# Patient Record
Sex: Female | Born: 2000 | Race: White | Hispanic: No | Marital: Single | Smoking: Never smoker
Health system: Southern US, Community
[De-identification: ages and names within clinical notes are randomized; demographics above are authoritative.]

## PROBLEM LIST (undated history)

## (undated) ENCOUNTER — Inpatient Hospital Stay (HOSPITAL_COMMUNITY): Payer: Self-pay

## (undated) DIAGNOSIS — J189 Pneumonia, unspecified organism: Secondary | ICD-10-CM

## (undated) DIAGNOSIS — J40 Bronchitis, not specified as acute or chronic: Secondary | ICD-10-CM

## (undated) HISTORY — PX: TONSILLECTOMY: SUR1361

---

## 2019-10-06 ENCOUNTER — Emergency Department (HOSPITAL_COMMUNITY)
Admission: EM | Admit: 2019-10-06 | Discharge: 2019-10-07 | Disposition: A | Discharge status: Home / Self Care | Payer: BC Managed Care – PPO | Attending: Emergency Medicine | Admitting: Emergency Medicine

## 2019-10-06 ENCOUNTER — Encounter (HOSPITAL_COMMUNITY): Payer: Self-pay

## 2019-10-06 ENCOUNTER — Emergency Department (HOSPITAL_COMMUNITY): Payer: BC Managed Care – PPO

## 2019-10-06 DIAGNOSIS — Z20822 Contact with and (suspected) exposure to covid-19: Secondary | ICD-10-CM | POA: Diagnosis not present

## 2019-10-06 DIAGNOSIS — R079 Chest pain, unspecified: Secondary | ICD-10-CM

## 2019-10-06 DIAGNOSIS — R0789 Other chest pain: Secondary | ICD-10-CM | POA: Diagnosis not present

## 2019-10-06 HISTORY — DX: Pneumonia, unspecified organism: J18.9

## 2019-10-06 HISTORY — DX: Bronchitis, not specified as acute or chronic: J40

## 2019-10-06 LAB — BASIC METABOLIC PANEL
Anion gap: 11 (ref 5–15)
BUN: 11 mg/dL (ref 6–20)
CO2: 22 mmol/L (ref 22–32)
Calcium: 9.3 mg/dL (ref 8.9–10.3)
Chloride: 106 mmol/L (ref 98–111)
Creatinine, Ser: 0.85 mg/dL (ref 0.44–1.00)
GFR calc Af Amer: >60 mL/min (ref >60)
GFR calc non Af Amer: >60 mL/min (ref >60)
Glucose, Bld: 94 mg/dL (ref 70–99)
Potassium: 3.7 mmol/L (ref 3.5–5.1)
Sodium: 139 mmol/L (ref 135–145)

## 2019-10-06 LAB — CBC
HCT: 44.4 % (ref 36.0–46.0)
Hemoglobin: 14.6 g/dL (ref 12.0–15.0)
MCH: 29.3 pg (ref 26.0–34.0)
MCHC: 32.9 g/dL (ref 30.0–36.0)
MCV: 89.2 fL (ref 80.0–100.0)
Platelets: 363 10*3/uL (ref 150–400)
RBC: 4.98 MIL/uL (ref 3.87–5.11)
RDW: 12.7 % (ref 11.5–15.5)
WBC: 10.4 10*3/uL (ref 4.0–10.5)
nRBC: 0 % (ref 0.0–0.2)

## 2019-10-06 LAB — I-STAT BETA HCG BLOOD, ED (MC, WL, AP ONLY): I-stat hCG, quantitative: <5 m[IU]/mL (ref <5)

## 2019-10-06 LAB — TROPONIN I (HIGH SENSITIVITY): Troponin I (High Sensitivity): <2 ng/L (ref <18)

## 2019-10-06 MED ORDER — SODIUM CHLORIDE 0.9% FLUSH: 3.0000 mL | Freq: Once | INTRAVENOUS | Status: DC

## 2019-10-06 NOTE — ED Notes (Signed)
Pt had near syncopal episode in triage, repeat BP 93/54, pt given a few minutes and taken to lobby in wheelchair

## 2019-10-06 NOTE — ED Triage Notes (Signed)
Pt states that for the past 6 days she has been having central CP, and a few hours ago began to have pain in her L arm, neuro intact bilaterally. Pain is worse with a deep breath.

## 2019-10-07 LAB — POC SARS CORONAVIRUS 2 AG -  ED: SARS Coronavirus 2 Ag: NEGATIVE

## 2019-10-07 LAB — D-DIMER, QUANTITATIVE: D-Dimer, Quant: <0.27 ug/mL-FEU (ref 0.00–0.50)

## 2019-10-07 LAB — TROPONIN I (HIGH SENSITIVITY): Troponin I (High Sensitivity): <2 ng/L (ref <18)

## 2019-10-07 MED ORDER — SODIUM CHLORIDE 0.9 % IV BOLUS
1000.0000 mL | Freq: Once | INTRAVENOUS | Status: AC
  Administered 2019-10-07: 03:00:00 1000 mL via INTRAVENOUS

## 2019-10-07 NOTE — ED Provider Notes (Signed)
[facility] EMERGENCY DEPARTMENT Provider Note   CSN: [id number] Arrival date & time: 10/06/19  2144     History Chief Complaint  Patient presents with  . Chest Pain    Michelle Le is a 19 y.o. female.  Patient presents to the emergency department with a chief complaint of chest pain.  She states that she has been having symptoms for about the past 6 days.  She states that today she noticed some increased pain with breathing.  States that she also sometimes feels anxious, but also has "bad lungs" and has had pneumonia and bronchitis before.  She states that she feels tight when she takes a deep breath.  Denies any fever or chills.  She has had cough.  Questionable coronavirus exposures.  She does use OCPs, but denies any recent long travel, surgery, or immobilization.  Denies any history of PE or DVT.  The history is provided by the patient. No language interpreter was used.       Past Medical History:  Diagnosis Date  . Bronchitis   . Pneumonia     There are no problems to display for this patient.   Past Surgical History:  Procedure Laterality Date  . TONSILLECTOMY       OB History   No obstetric history on file.     No family history on file.  Social History   Tobacco Use  . Smoking status: Never Smoker  . Smokeless tobacco: Never Used  Substance Use Topics  . Alcohol use: Never  . Drug use: Never    Home Medications Prior to Admission medications   Not on File    Allergies    Amoxicillin, Azithromycin, Bee venom, and Penicillins  Review of Systems   Review of Systems  All other systems reviewed and are negative.   Physical Exam Updated Vital Signs BP 124/68   Pulse 75   Temp 98.2 F (36.8 C) (Oral)   Resp 16   LMP 08/25/2019   SpO2 99%   Physical Exam Vitals and nursing note reviewed.  Constitutional:      General: She is not in acute distress.    Appearance: She is well-developed.  HENT:     Head:  Normocephalic and atraumatic.  Eyes:     Conjunctiva/sclera: Conjunctivae normal.  Cardiovascular:     Rate and Rhythm: Normal rate and regular rhythm.     Heart sounds: No murmur.  Pulmonary:     Effort: Pulmonary effort is normal. No respiratory distress.     Breath sounds: Normal breath sounds.  Abdominal:     Palpations: Abdomen is soft.     Tenderness: There is no abdominal tenderness.  Musculoskeletal:        General: Normal range of motion.     Cervical back: Neck supple.  Skin:    General: Skin is warm and dry.  Neurological:     Mental Status: She is alert and oriented to person, place, and time.  Psychiatric:        Mood and Affect: Mood normal.        Behavior: Behavior normal.     ED Results / Procedures / Treatments   Labs (all labs ordered are listed, but only abnormal results are displayed) Labs Reviewed  BASIC METABOLIC PANEL  CBC  D-DIMER, QUANTITATIVE (NOT AT [facility])  I-STAT BETA HCG BLOOD, ED ([facility], [facility], [facility] ONLY)  POC SARS CORONAVIRUS 2 AG -  ED  TROPONIN I (HIGH SENSITIVITY)  TROPONIN I (HIGH SENSITIVITY)    EKG EKG Interpretation  Date/Time:  Wednesday October 06 2019 22:10:39 EST Ventricular Rate:  81 PR Interval:  108 QRS Duration: 92 QT Interval:  344 QTC Calculation: 399 R Axis:   91 Text Interpretation: Sinus rhythm with marked sinus arrhythmia with short PR Right atrial enlargement Rightward axis Pulmonary disease pattern Confirmed by Palumbo, April ([id number]) on 10/07/2019 3:03:13 AM   Radiology DG Chest 2 View  Result Date: 10/06/2019 CLINICAL DATA:  Chest pain and left arm numbness. EXAM: CHEST - 2 VIEW COMPARISON:  None. FINDINGS: The heart size and mediastinal contours are within normal limits. Both lungs are clear. The visualized skeletal structures are unremarkable. IMPRESSION: No active cardiopulmonary disease. Electronically Signed   By: Aram Candela M.D.   On: 10/06/2019 22:29    Procedures Procedures (including critical care  time)  Medications Ordered in ED Medications  sodium chloride flush (NS) 0.9 % injection 3 mL (has no administration in time range)  sodium chloride 0.9 % bolus 1,000 mL (1,000 mLs Intravenous New Bag/Given 10/07/19 0235)    ED Course  I have reviewed the triage vital signs and the nursing notes.  Pertinent labs & imaging results that were available during my care of the patient were reviewed by me and considered in my medical decision making (see chart for details).    MDM Rules/Calculators/A&P                      Patient presents with chest pain x6 days, seems to be pleuritic in nature.  Patient did have near syncope in triage as blood was being drawn, likely vasovagal, BP was 93/54.  BP is stable now.  She is not orthostatic.  DDx includes ACS, PE, pneumothorax, aortic dissection, esophageal rupture, pericarditis, chest wall pain.  Doubt ACS, normal troponin, no ischemic EKG findings, is low risk for ACS given age and lack of risk factors.  Check D-dimer given pleuritic chest pain and OCP use, patient is not tachycardic nor hypoxic, if D-dimer is negative, I think she will be low enough risk to be discharged from PE standpoint.  No evidence of pneumothorax on CXR.  Doubt dissection, no mediastinal widening on CXR, no ripping/tearing chest pain, neurovascularly intact.  Doubt pericarditis, no positional changes, or diffuse ST elevations on EKG.  Pain is not reproducible, doubt MSK.  All findings were discussed with patient.  Patient understands and agrees with the plan.    Covid test is still pending.  I would not be surprised if this is positive, and is the cause of the patient's symptoms.  Otherwise, see no emergent indication for additional work-up.  I do not believe the patient needs admission to the hospital.  I believe she can be safely discharged home with close primary care follow-up.  Patient understands agrees with the plan.  Michelle Le was evaluated in Emergency  Department on 10/07/2019 for the symptoms described in the history of present illness. She was evaluated in the context of the global COVID-19 pandemic, which necessitated consideration that the patient might be at risk for infection with the SARS-CoV-2 virus that causes COVID-19. Institutional protocols and algorithms that pertain to the evaluation of patients at risk for COVID-19 are in a state of rapid change based on information released by regulatory bodies including the CDC and federal and state organizations. These policies and algorithms were followed during the patient's care in the ED.   Final Clinical Impression(s) / ED  Diagnoses Final diagnoses:  Nonspecific chest pain    Rx / DC Orders ED Discharge Orders    None       Roxy Horseman, PA-C 10/07/19 0340    Palumbo, April, MD 10/07/19 [phone]

## 2019-10-07 NOTE — Discharge Instructions (Signed)
You should isolate at home until your covid test results.  If positive, you need to quarantine at home for 14 days.

## 2021-08-29 IMAGING — CR DG CHEST 2V
2 series · 2 of 2 positions shown · non-contrast
Comparison: None.

CLINICAL DATA: Chest pain and left arm numbness.

EXAM:
CHEST - 2 VIEW

[chest lat]
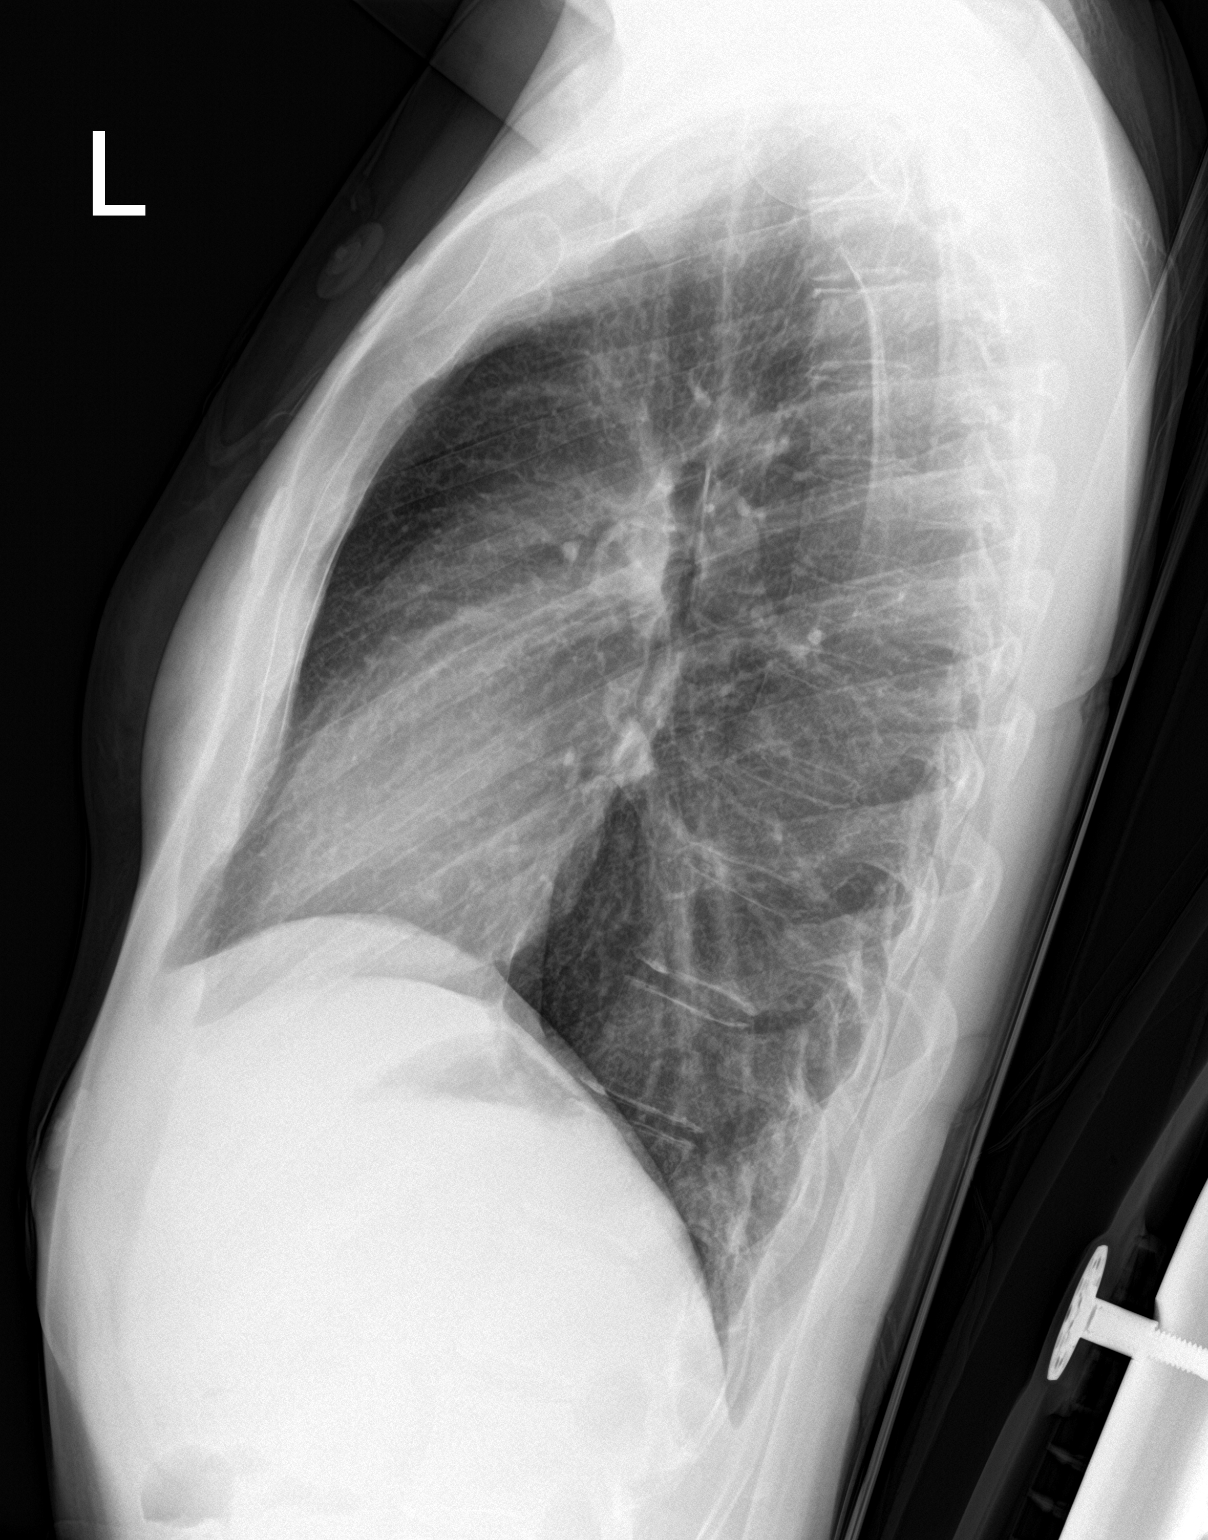

[chest ap]
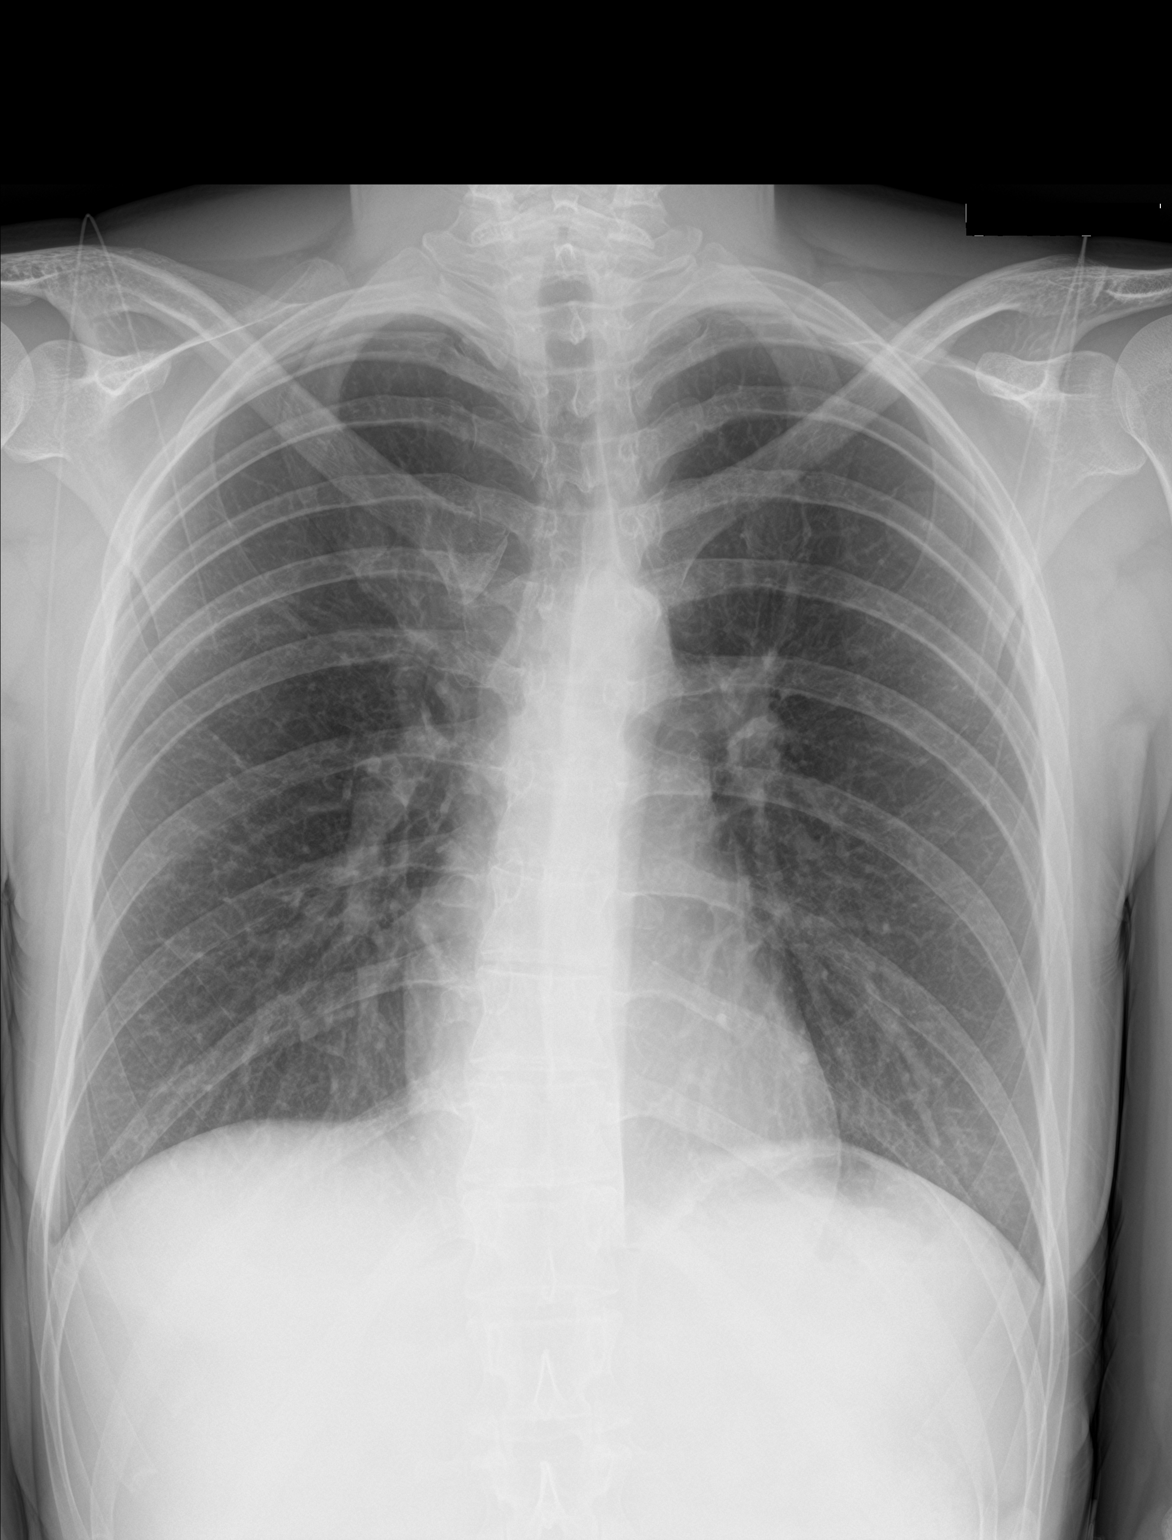

[2 of 2 positions shown; findings below may reference images not displayed]

FINDINGS: The heart size and mediastinal contours are within normal limits.
Both lungs are clear. The visualized skeletal structures are
unremarkable.
IMPRESSION: No active cardiopulmonary disease.

## 2022-09-02 NOTE — L&D Delivery Note (Signed)
Delivery Note Michelle Le is a 22 year old G1 now P1001 at [redacted]w[redacted]d who had a spontaneous delivery at 01:30 a healthy baby boy Michelle Fireman!") was delivered via  (Presentation: vertex; occiput anterior ).  APGAR: 9, 9; weight pending at the time of note writing.     Admitted for labor. Progressed normally and desired AROM, which was performed. Received epidural for pain management. Pushed for 1 hour and 28 minutes. Baby was delivered without difficulty. One loose nuchal was reduced after delivery of the infant's head, shoulders were delivered easily. Copious meconium stained fluid flowed out after delivery of the baby. Delayed cord clamping for 60 seconds.  Delivery of placenta was spontaneous. Placenta was found to be intact, meconium stained, 3 -vessel cord was noted. The fundus was found to be firm. Periurethral and small right hymenal ring tears repaired in the normal sterile fashion with 3-0 vicryl rapide. Estimated blood loss 250cc. Cord gases not sent. Instrument and gauze counts were correct at the end of the procedure.   Placenta status:  intact, meconium stained, 3 -vessel cord  with the following complications: none  Anesthesia:  epidural Episiotomy:  none Lacerations:  Periurethral and small right hymenal ring Suture Repair: 3.0 vicryl rapide Est. Blood Loss (mL):  250cc  Mom to postpartum.  Baby to Couplet care / Skin to Skin.  Michelle Le 08/26/2023, 2:04 AM

## 2023-01-17 LAB — OB RESULTS CONSOLE GC/CHLAMYDIA
Chlamydia: NEGATIVE
Neisseria Gonorrhea: NEGATIVE

## 2023-02-13 LAB — OB RESULTS CONSOLE HIV ANTIBODY (ROUTINE TESTING): HIV: NONREACTIVE

## 2023-02-13 LAB — OB RESULTS CONSOLE HEPATITIS B SURFACE ANTIGEN: Hepatitis B Surface Ag: NEGATIVE

## 2023-02-13 LAB — OB RESULTS CONSOLE RPR: RPR: NONREACTIVE

## 2023-02-13 LAB — OB RESULTS CONSOLE RUBELLA ANTIBODY, IGM: Rubella: IMMUNE

## 2023-08-07 ENCOUNTER — Other Ambulatory Visit: Payer: Self-pay

## 2023-08-07 ENCOUNTER — Inpatient Hospital Stay (HOSPITAL_COMMUNITY)
Admission: AD | Admit: 2023-08-07 | Discharge: 2023-08-07 | Disposition: A | Discharge status: Home / Self Care | Payer: Managed Care, Other (non HMO) | Attending: Obstetrics and Gynecology | Admitting: Obstetrics and Gynecology

## 2023-08-07 ENCOUNTER — Encounter (HOSPITAL_COMMUNITY): Payer: Self-pay | Admitting: Obstetrics and Gynecology

## 2023-08-07 DIAGNOSIS — O98813 Other maternal infectious and parasitic diseases complicating pregnancy, third trimester: Secondary | ICD-10-CM | POA: Insufficient documentation

## 2023-08-07 DIAGNOSIS — B3731 Acute candidiasis of vulva and vagina: Secondary | ICD-10-CM | POA: Insufficient documentation

## 2023-08-07 DIAGNOSIS — R42 Dizziness and giddiness: Secondary | ICD-10-CM | POA: Diagnosis present

## 2023-08-07 DIAGNOSIS — O479 False labor, unspecified: Secondary | ICD-10-CM

## 2023-08-07 DIAGNOSIS — O42913 Preterm premature rupture of membranes, unspecified as to length of time between rupture and onset of labor, third trimester: Secondary | ICD-10-CM | POA: Diagnosis not present

## 2023-08-07 DIAGNOSIS — Z3A36 36 weeks gestation of pregnancy: Secondary | ICD-10-CM | POA: Insufficient documentation

## 2023-08-07 DIAGNOSIS — O26893 Other specified pregnancy related conditions, third trimester: Secondary | ICD-10-CM | POA: Diagnosis not present

## 2023-08-07 DIAGNOSIS — O23593 Infection of other part of genital tract in pregnancy, third trimester: Secondary | ICD-10-CM | POA: Insufficient documentation

## 2023-08-07 DIAGNOSIS — O368130 Decreased fetal movements, third trimester, not applicable or unspecified: Secondary | ICD-10-CM | POA: Insufficient documentation

## 2023-08-07 LAB — RUPTURE OF MEMBRANE (ROM)PLUS: Rom Plus: NEGATIVE

## 2023-08-07 LAB — URINALYSIS, ROUTINE W REFLEX MICROSCOPIC
Bilirubin Urine: NEGATIVE
Glucose, UA: NEGATIVE mg/dL
Hgb urine dipstick: NEGATIVE
Ketones, ur: NEGATIVE mg/dL
Leukocytes,Ua: NEGATIVE
Nitrite: NEGATIVE
Protein, ur: NEGATIVE mg/dL
Specific Gravity, Urine: 1.013 (ref 1.005–1.030)
pH: 7 (ref 5.0–8.0)

## 2023-08-07 LAB — WET PREP, GENITAL
Clue Cells Wet Prep HPF POC: NONE SEEN
Sperm: NONE SEEN
Trich, Wet Prep: NONE SEEN
WBC, Wet Prep HPF POC: <10 (ref <10)
Yeast Wet Prep HPF POC: NONE SEEN

## 2023-08-07 LAB — POCT FERN TEST

## 2023-08-07 MED ORDER — FLUCONAZOLE 150 MG PO TABS: 150.0000 mg | ORAL_TABLET | Freq: Once | ORAL | 1 refills | Status: AC

## 2023-08-07 NOTE — MAU Provider Note (Addendum)
Attestation of Supervision of Student:  I confirm that I have verified the information documented in the medical residents's note and that I have also personally supervised the history, physical exam and all medical decision making activities.  I have verified that all services and findings are accurately documented in this student's note; and I agree with management and plan as outlined in the documentation. I have also made any necessary editorial changes.  Richardson Landry, CNM [facility] [employer], [facility] 08/07/2023 9:34 PM    History     CSN: [id number]  Arrival date and time: 08/07/23 1706   Event Date/Time   First Provider Initiated Contact with Patient 08/07/23 1741      Chief Complaint  Patient presents with   Dizziness   Decreased Fetal Movement   Past 2 days, feeling very dizzy and was having headache. Today, noticed shooting pain up the spine and decreased fetal movement. Had noticed sensations that feel like the baby is hiccupping for a few weeks, states this has been more frequent especially at night recently. Has not tried any pain medications for the headache or back pain. States she feels she has been urinating more frequently during the night. Denies burning with urination or vaginal itching. Reports some increase in vaginal discharge, states this is white and thick. Since arriving to the MAU, patient noticed that her underwear were soaked with clear fluid. Felt the baby moving upon arrival to MAU.    OB History     Gravida  1   Para      Term      Preterm      AB      Living         SAB      IAB      Ectopic      Multiple      Live Births              Past Medical History:  Diagnosis Date   Bronchitis    Pneumonia     Past Surgical History:  Procedure Laterality Date   TONSILLECTOMY      No family history on file.  Social History   Tobacco Use   Smoking status: Never   Smokeless tobacco: Never   Substance Use Topics   Alcohol use: Never   Drug use: Never    Allergies:  Allergies  Allergen Reactions   Amoxicillin Hives   Azithromycin Hives   Bee Venom Swelling   Penicillins Hives    Medications Prior to Admission  Medication Sig Dispense Refill Last Dose   Prenatal Vit-Fe Fumarate-FA (PRENATAL MULTIVITAMIN) TABS tablet Take 1 tablet by mouth daily at 12 noon.       Review of Systems  Respiratory:         Reports dyspnea with exertion last night. No dyspnea at rest or currently  Genitourinary:  Positive for urgency and vaginal discharge.  Musculoskeletal:  Positive for back pain.   Physical Exam   Blood pressure 126/76, pulse 86, temperature 98.7 F (37.1 C), temperature source Oral, resp. rate 18, height 5\' 8"  (1.727 m), weight 81.6 kg, SpO2 98%.  Physical Exam Exam conducted with a chaperone present.  Constitutional:      Appearance: Normal appearance.  HENT:     Head: Normocephalic and atraumatic.  Pulmonary:     Effort: Pulmonary effort is normal.  Abdominal:     Palpations: Abdomen is soft.  Genitourinary:    Labia:  Right: Rash present.        Left: Rash present.      Comments: Labia erythematous Thick, white discharge present at introitus Neurological:     Mental Status: She is alert.     Fetal Assessment 140 bpm, Mod Var, -Decels, +Accels Toco: irregular, every 5-6 minutes  MAU Course   Results for orders placed or performed during the hospital encounter of 08/07/23 (from the past 24 hour(s))  Wet prep, genital     Status: None   Collection Time: 08/07/23  6:03 PM  Result Value Ref Range   Yeast Wet Prep HPF POC NONE SEEN NONE SEEN   Trich, Wet Prep NONE SEEN NONE SEEN   Clue Cells Wet Prep HPF POC NONE SEEN NONE SEEN   WBC, Wet Prep HPF POC <10 <10   Sperm NONE SEEN   Fern Test     Status: Normal   Collection Time: 08/07/23  6:35 PM  Result Value Ref Range   POCT Fern Test     No results found.  MDM PE Labs: wet prep,  UA, fern test, ROM plus EFM  Assessment and Plan  22 year old G1P0  SIUP at [redacted]w[redacted]d Cat 1 FT  Decreased fetal movement -Exam findings discussed -Fern test negative -reassuringly reactive FHT  Candida vaginitis -wet prep negative but exam findings and history consistent with candidal infection  19:20- Reassessed patient, who appears more uncomfortable compared to exam on arrival to MAU. Cervical exam showed 2/90/-3. Pending ROM plus. Will repeat cervical exam in 1-2 hours.   Lorayne Bender PGY-1, Family Medicine 08/07/2023, 6:39 PM   Hand off of care to Lamont Snowball, CNM at 2000.  Reassessment (8:35pm)  Patient with no cervical change after an hour with same practitioner performing exam.  ROM plus negative.  Given information regarding early labor, when to return to MAU. Patient has appointment 08/13/23 with regular OB provider. Strict precautions for bleeding. Discharged in stable condition.  Lamont Snowball, MSN, CNM, RNC-OB Certified Nurse Midwife, [facility] 08/07/2023 9:38 PM

## 2023-08-07 NOTE — Discharge Instructions (Signed)

## 2023-08-07 NOTE — MAU Note (Signed)
..  Michelle Le is a 22 y.o. at [redacted]w[redacted]d here in MAU reporting: DFM since this morning around 0200 with abnormal occasional "thumps". She is also having dizziness and sharp shooting pain up her spine but no pain currently. She has notices thin liquid in her underwear today. Denies vaginal bleeding.  Onset of complaint: 08/06/23 Pain score: 0/10 Vitals:   08/07/23 1726  BP: 126/76  Pulse: 86  Resp: 18  Temp: 98.7 F (37.1 C)  SpO2: 98%     FHT:130 Lab orders placed from triage:  UA

## 2023-08-13 LAB — OB RESULTS CONSOLE GBS: GBS: NEGATIVE

## 2023-08-23 ENCOUNTER — Inpatient Hospital Stay (HOSPITAL_COMMUNITY)
Admission: AD | Admit: 2023-08-23 | Discharge: 2023-08-23 | Disposition: A | Discharge status: Home / Self Care | Payer: Managed Care, Other (non HMO) | Source: Home / Self Care | Attending: Obstetrics and Gynecology | Admitting: Obstetrics and Gynecology

## 2023-08-23 ENCOUNTER — Encounter (HOSPITAL_COMMUNITY): Payer: Self-pay | Admitting: Obstetrics and Gynecology

## 2023-08-23 DIAGNOSIS — O368130 Decreased fetal movements, third trimester, not applicable or unspecified: Secondary | ICD-10-CM | POA: Insufficient documentation

## 2023-08-23 DIAGNOSIS — O471 False labor at or after 37 completed weeks of gestation: Secondary | ICD-10-CM | POA: Insufficient documentation

## 2023-08-23 DIAGNOSIS — O26893 Other specified pregnancy related conditions, third trimester: Secondary | ICD-10-CM | POA: Diagnosis not present

## 2023-08-23 DIAGNOSIS — Z3A38 38 weeks gestation of pregnancy: Secondary | ICD-10-CM

## 2023-08-23 DIAGNOSIS — Z3689 Encounter for other specified antenatal screening: Secondary | ICD-10-CM

## 2023-08-23 NOTE — Discharge Instructions (Signed)
2/3-1-1 Rule Go to MAU for painful contractions every 2-3 minutes, lasting 1 minute each for 1.5 hours.  

## 2023-08-23 NOTE — MAU Provider Note (Signed)
History     CSN: [id number]  Arrival date and time: 08/23/23 7829   Event Date/Time   First Provider Initiated Contact with Patient 08/23/23 [phone]      Chief Complaint  Patient presents with   Decreased Fetal Movement   HPI [name] is a 22 y.o. year old G1P0 female at [redacted]w[redacted]d weeks gestation who presents to MAU reporting DFM since yesterday. She states, "I have felt no FM since yesterday." She had an OB appt yesterday. Her cervix was dilated 3 cm at yesterday's appt. She denies any VB or LOF. She does report an increase in vaginal d/c x 2 weeks. She reports she was started on Lexapro yesterday d/t anxiety. She receives [facility] with [facility]; next appt is 08/28/2023. Her significant other is present and contributing to the history taking.    OB History     Gravida  1   Para      Term      Preterm      AB      Living         SAB      IAB      Ectopic      Multiple      Live Births              Past Medical History:  Diagnosis Date   Bronchitis    Pneumonia     Past Surgical History:  Procedure Laterality Date   TONSILLECTOMY      History reviewed. No pertinent family history.  Social History   Tobacco Use   Smoking status: Never   Smokeless tobacco: Never  Vaping Use   Vaping status: Never Used  Substance Use Topics   Alcohol use: Never   Drug use: Never    Allergies:  Allergies  Allergen Reactions   Amoxicillin Hives   Azithromycin Hives   Bee Venom Swelling   Penicillins Hives    Medications Prior to Admission  Medication Sig Dispense Refill Last Dose/Taking   escitalopram (LEXAPRO) 10 MG tablet Take 10 mg by mouth daily.   08/22/2023   Prenatal Vit-Fe Fumarate-FA (PRENATAL MULTIVITAMIN) TABS tablet Take 1 tablet by mouth daily at 12 noon.   08/22/2023    Review of Systems  Constitutional: Negative.   HENT: Negative.    Eyes: Negative.   Respiratory: Negative.    Cardiovascular: Negative.    Gastrointestinal: Negative.   Endocrine: Negative.   Genitourinary:  Positive for pelvic pain ("infrequent, stronger" contractions).  Musculoskeletal: Negative.   Skin: Negative.   Allergic/Immunologic: Negative.   Neurological: Negative.   Hematological: Negative.   Psychiatric/Behavioral: Negative.     Physical Exam   Blood pressure 132/83, pulse 90, temperature 98.1 F (36.7 C), temperature source Oral, resp. rate 18, height 5\' 8"  (1.727 m), weight 84.4 kg, SpO2 97%.  Physical Exam Vitals and nursing note reviewed.  Constitutional:      Appearance: Normal appearance. She is normal weight.  Cardiovascular:     Rate and Rhythm: Normal rate.  Pulmonary:     Effort: Pulmonary effort is normal.  Genitourinary:    Comments: Dilation: 3 Effacement (%): 80 Cervical Position: Middle Station: -1 Presentation: Vertex Exam by: Erle Crocker, RN  Musculoskeletal:        General: Normal range of motion.  Neurological:     Mental Status: She is alert and oriented to person, place, and time.  Psychiatric:        Mood and Affect:  Mood normal.        Behavior: Behavior normal.        Thought Content: Thought content normal.        Judgment: Judgment normal.    REACTIVE NST - FHR: 135 bpm / moderate variability / accels present / decels absent / TOCO: irregular every 2.5-5 mins   MAU Course  Procedures  MDM EFM x 2 hours  Assessment and Plan  1. NST (non-stress test) reactive (Primary) - Reassurance given that FHR tracing is reactive Explained with reactive means and how that is significant to determining fetal well-being. - Patient and significant other shown FHR tracing during explanation. Verbal understanding of explanation given by both.  2. False labor after 37 weeks of gestation without delivery - Information provided on sign and symptoms of labor  - Discussed 2/3-1-1 Rule: Return to MAU for painful contractions every 2-3 minutes, lasting 1 minute each for 1.5  hours.  3. [redacted] weeks gestation of pregnancy   - Discharge patient - Keep scheduled appt with [facility] on 08/28/2023 - Patient verbalized an understanding of the plan of care and agrees.   Raelyn Mora, CNM 08/23/2023, 8:44 AM

## 2023-08-23 NOTE — MAU Note (Signed)
.  Michelle Le is a 23 y.o. at [redacted]w[redacted]d here in MAU reporting: DFM since yesterday. She reports she has not felt any fetal movement since yesterday. Denies VB or LOF. Reports an increase in vaginal discharge over the past couple of weeks.   She reports she just started Lexapro yesterday due to her anxiety.  Onset of complaint: Yesterday Pain score: Denies pain.  FHT: 145 initial external Lab orders placed from triage: none - fetal clicker given

## 2023-08-25 ENCOUNTER — Inpatient Hospital Stay (HOSPITAL_COMMUNITY)
Admission: AD | Admit: 2023-08-25 | Discharge: 2023-08-28 | DRG: 807 | Disposition: A | Discharge status: Home / Self Care | Payer: Managed Care, Other (non HMO) | Attending: Obstetrics and Gynecology | Admitting: Obstetrics and Gynecology

## 2023-08-25 ENCOUNTER — Inpatient Hospital Stay (HOSPITAL_COMMUNITY): Payer: Managed Care, Other (non HMO) | Admitting: Anesthesiology

## 2023-08-25 ENCOUNTER — Encounter (HOSPITAL_COMMUNITY): Payer: Self-pay | Admitting: Obstetrics and Gynecology

## 2023-08-25 ENCOUNTER — Other Ambulatory Visit: Payer: Self-pay

## 2023-08-25 DIAGNOSIS — Z8249 Family history of ischemic heart disease and other diseases of the circulatory system: Secondary | ICD-10-CM

## 2023-08-25 DIAGNOSIS — O99344 Other mental disorders complicating childbirth: Secondary | ICD-10-CM | POA: Diagnosis present

## 2023-08-25 DIAGNOSIS — O26893 Other specified pregnancy related conditions, third trimester: Secondary | ICD-10-CM | POA: Diagnosis present

## 2023-08-25 DIAGNOSIS — F419 Anxiety disorder, unspecified: Secondary | ICD-10-CM | POA: Diagnosis present

## 2023-08-25 DIAGNOSIS — Z3A38 38 weeks gestation of pregnancy: Secondary | ICD-10-CM | POA: Diagnosis not present

## 2023-08-25 LAB — CBC
HCT: 39 % (ref 36.0–46.0)
Hemoglobin: 12.6 g/dL (ref 12.0–15.0)
MCH: 26.5 pg (ref 26.0–34.0)
MCHC: 32.3 g/dL (ref 30.0–36.0)
MCV: 82.1 fL (ref 80.0–100.0)
Platelets: 372 10*3/uL (ref 150–400)
RBC: 4.75 MIL/uL (ref 3.87–5.11)
RDW: 13.5 % (ref 11.5–15.5)
WBC: 17.5 10*3/uL — ABNORMAL HIGH (ref 4.0–10.5)
nRBC: 0 % (ref 0.0–0.2)

## 2023-08-25 LAB — TYPE AND SCREEN
ABO/RH(D): B POS
Antibody Screen: NEGATIVE

## 2023-08-25 MED ORDER — OXYCODONE-ACETAMINOPHEN 5-325 MG PO TABS: 1.0000 | ORAL_TABLET | ORAL | Status: DC | PRN

## 2023-08-25 MED ORDER — EPHEDRINE 5 MG/ML INJ: 10.0000 mg | INTRAVENOUS | Status: DC | PRN

## 2023-08-25 MED ORDER — SOD CITRATE-CITRIC ACID 500-334 MG/5ML PO SOLN: 30.0000 mL | ORAL | Status: DC | PRN

## 2023-08-25 MED ORDER — LIDOCAINE HCL (PF) 1 % IJ SOLN: 30.0000 mL | INTRAMUSCULAR | Status: DC | PRN

## 2023-08-25 MED ORDER — SODIUM CHLORIDE 0.9 % IV SOLN: 250.0000 mL | INTRAVENOUS | Status: DC | PRN

## 2023-08-25 MED ORDER — DIPHENHYDRAMINE HCL 50 MG/ML IJ SOLN
12.5000 mg | INTRAMUSCULAR | Status: DC | PRN
  Administered 2023-08-26: 12.5 mg via INTRAVENOUS
  Filled 2023-08-25: qty 1

## 2023-08-25 MED ORDER — OXYTOCIN BOLUS FROM INFUSION
333.0000 mL | Freq: Once | INTRAVENOUS | Status: AC
  Administered 2023-08-26: 333 mL via INTRAVENOUS

## 2023-08-25 MED ORDER — OXYCODONE-ACETAMINOPHEN 5-325 MG PO TABS: 2.0000 | ORAL_TABLET | ORAL | Status: DC | PRN

## 2023-08-25 MED ORDER — PHENYLEPHRINE 80 MCG/ML (10ML) SYRINGE FOR IV PUSH (FOR BLOOD PRESSURE SUPPORT): 80.0000 ug | PREFILLED_SYRINGE | INTRAVENOUS | Status: DC | PRN

## 2023-08-25 MED ORDER — LACTATED RINGERS IV SOLN: INTRAVENOUS | Status: DC

## 2023-08-25 MED ORDER — SODIUM CHLORIDE 0.9% FLUSH: 3.0000 mL | INTRAVENOUS | Status: DC | PRN

## 2023-08-25 MED ORDER — LACTATED RINGERS IV SOLN: 500.0000 mL | INTRAVENOUS | Status: DC | PRN

## 2023-08-25 MED ORDER — ACETAMINOPHEN 325 MG PO TABS: 650.0000 mg | ORAL_TABLET | ORAL | Status: DC | PRN

## 2023-08-25 MED ORDER — FENTANYL-BUPIVACAINE-NACL 0.5-0.125-0.9 MG/250ML-% EP SOLN
12.0000 mL/h | EPIDURAL | Status: DC | PRN
  Administered 2023-08-25: 12 mL/h via EPIDURAL

## 2023-08-25 MED ORDER — OXYTOCIN-SODIUM CHLORIDE 30-0.9 UT/500ML-% IV SOLN
2.5000 [IU]/h | INTRAVENOUS | Status: DC
  Filled 2023-08-25: qty 500

## 2023-08-25 MED ORDER — LACTATED RINGERS IV SOLN
500.0000 mL | Freq: Once | INTRAVENOUS | Status: AC
  Administered 2023-08-25: 500 mL via INTRAVENOUS

## 2023-08-25 MED ORDER — SODIUM CHLORIDE 0.9% FLUSH: 3.0000 mL | Freq: Two times a day (BID) | INTRAVENOUS | Status: DC

## 2023-08-25 MED ORDER — FENTANYL-BUPIVACAINE-NACL 0.5-0.125-0.9 MG/250ML-% EP SOLN
EPIDURAL | Status: AC
  Filled 2023-08-25: qty 250

## 2023-08-25 MED ORDER — LIDOCAINE HCL (PF) 1 % IJ SOLN
INTRAMUSCULAR | Status: DC | PRN
  Administered 2023-08-25: 11 mL via EPIDURAL

## 2023-08-25 MED ORDER — ONDANSETRON HCL 4 MG/2ML IJ SOLN: 4.0000 mg | Freq: Four times a day (QID) | INTRAMUSCULAR | Status: DC | PRN

## 2023-08-25 NOTE — H&P (Signed)
Michelle Le is a 22 y.o. G1P0 female at [redacted]w[redacted]d (by 8w [place]) presenting for labor. She reports regular painful contractions for the past several hours. She denies vaginal bleeding, LOF, or decreased fetal movement. Denies any other complaines.  Her pregnancy is otherwise complicated by: -anxiety -asthma: no albuterol use recently OB History     Gravida  1   Para      Term      Preterm      AB      Living         SAB      IAB      Ectopic      Multiple      Live Births             Past Medical History:  Diagnosis Date   Bronchitis    Pneumonia    Past Surgical History:  Procedure Laterality Date   TONSILLECTOMY     Family History: breast cancer in grandmother, HTN in mother, prostate cancer in grandfather Social History:  reports that she has never smoked. She has never used smokeless tobacco. She reports that she does not drink alcohol and does not use drugs.     Maternal Diabetes: No Genetic Screening: Normal Maternal Ultrasounds/Referrals: Normal Fetal Ultrasounds or other Referrals:  None Maternal Substance Abuse:  No Significant Maternal Medications:  Meds include: Other: Lexapro just prescribed Significant Maternal Lab Results:  Group B Strep negative Number of Prenatal Visits:greater than 3 verified prenatal visits Maternal Vaccinations:RSV: Given during pregnancy >/=14 days ago, TDap, and Flu   Review of Systems  All other systems reviewed and are negative.  Maternal Medical History:  Reason for admission: Contractions.   Contractions: Onset was 3-5 hours ago.   Frequency: regular.   Perceived severity is strong.   Fetal activity: Perceived fetal activity is normal.   Prenatal complications: No bleeding, cholelithiasis, HIV, PIH, infection, IUGR, nephrolithiasis, oligohydramnios, placental abnormality, polyhydramnios, pre-eclampsia, preterm labor, substance abuse, thrombocytopenia or thrombophilia.   Prenatal Complications - Diabetes:  none.   Dilation: 6.5 Effacement (%): 80 Station: 0 Exam by:: Arther Abbott RN Blood pressure 118/66, pulse 77, temperature 98.2 F (36.8 C), temperature source Oral, resp. rate 20, height 5\' 8"  (1.727 m), weight 81.6 kg, SpO2 98%. Maternal Exam:  Abdomen: Fetal presentation: vertex Pelvis: adequate for delivery.   Cervix: not evaluated.   Physical Exam Vitals and nursing note reviewed.  HENT:     Head: Normocephalic.  Eyes:     Extraocular Movements: Extraocular movements intact.  Cardiovascular:     Comments: Well perfused Pulmonary:     Effort: Pulmonary effort is normal.  Abdominal:     Comments: Gravid  Musculoskeletal:     Cervical back: Normal range of motion.  Skin:    General: Skin is warm.  Neurological:     General: No focal deficit present.     Mental Status: She is alert and oriented to person, place, and time.  Psychiatric:        Mood and Affect: Mood normal.        Behavior: Behavior normal.        Thought Content: Thought content normal.        Judgment: Judgment normal.     Prenatal labs: ABO, Rh: --/--/B POS (12/23 1602) Antibody: NEG (12/23 1602) Rubella: Immune (06/13 0000) RPR: Nonreactive (06/13 0000)  HBsAg: Negative (06/13 0000)  HIV: Non-reactive (06/13 0000)  GBS: Negative/-- (12/11 0000)   Assessment/Plan: Pearline Cables  Madero is a 22 y.o. G1P0 female at [redacted]w[redacted]d (by 8w [place]) presenting for labor. -labor: desires epidural placement. Expectant management -fetal wellbeing: Cat 1 -h/o asthma: not a candidate for hemabate -anxiety  Dispo: anticipate vaginal delivery  Esten Dollar A Demarr Kluever 08/25/2023, 5:53 PM

## 2023-08-25 NOTE — Anesthesia Procedure Notes (Signed)
Epidural Patient location during procedure: OB Start time: 08/25/2023 4:57 PM End time: 08/25/2023 5:09 PM  Staffing Anesthesiologist: Lowella Curb, MD Performed: anesthesiologist   Preanesthetic Checklist Completed: patient identified, IV checked, site marked, risks and benefits discussed, surgical consent, monitors and equipment checked, pre-op evaluation and timeout performed  Epidural Patient position: sitting Prep: ChloraPrep Patient monitoring: heart rate, cardiac monitor, continuous pulse ox and blood pressure Approach: midline Location: L2-L3 Injection technique: LOR saline  Needle:  Needle type: Tuohy  Needle gauge: 17 G Needle length: 9 cm Needle insertion depth: 6 cm Catheter type: closed end flexible Catheter size: 20 Guage Catheter at skin depth: 10 cm Test dose: negative  Assessment Events: blood not aspirated, injection not painful, no injection resistance, no paresthesia and negative IV test  Additional Notes Reason for block:procedure for pain

## 2023-08-25 NOTE — Progress Notes (Addendum)
Michelle Le is a 22 y.o. G1P0 female at [redacted]w[redacted]d (by 8w [place]) admitted for labor.   Subjective: Doing well s/p epidural placement  Objective: BP 124/72   Pulse 74   Temp 98.2 F (36.8 C) (Oral)   Resp 20   Ht 5\' 8"  (1.727 m)   Wt 81.6 kg   SpO2 98%   BMI 27.37 kg/m  No intake/output data recorded. Total I/O In: 1441.6 [P.O.:300; I.V.:1141.6] Out: -   FHT:  FHR: 140 bpm, variability: moderate,  accelerations:  Present,  decelerations:  Absent aside from 1 variable with spontaneous recovery UC:   regular, every 2-4 minutes SVE:   Dilation: 7.5 Effacement (%): 100 Station: -1 Exam by:: Dr. Clint Lipps  Labs: Lab Results  Component Value Date   WBC 17.5 (H) 08/25/2023   HGB 12.6 08/25/2023   HCT 39.0 08/25/2023   MCV 82.1 08/25/2023   PLT 372 08/25/2023    Assessment / Plan: Spontaneous labor, progressing normally  Labor: Progressing normally Fetal Wellbeing:  Category I Pain Control:  Epidural Anticipated MOD:  NSVD  Willa Frater, MD 08/25/2023, 6:36 PM

## 2023-08-25 NOTE — Progress Notes (Signed)
Michelle Le is a 22 y.o. G1P0 female at [redacted]w[redacted]d (by 8w [place]) admitted for labor.   Subjective: Doing well, feeling more pressure in her back  Objective: BP 124/62   Pulse 84   Temp 98.7 F (37.1 C) (Oral)   Resp 17   Ht 5\' 8"  (1.727 m)   Wt 81.6 kg   SpO2 98%   BMI 27.37 kg/m  I/O last 3 completed shifts: In: 1441.6 [P.O.:300; I.V.:1141.6] Out: -  Total I/O In: -  Out: 300 [Urine:300]  FHT:  FHR: 145 bpm, variability: moderate,  accelerations:  Present,  decelerations:  intermittent variables UC:   regular, every 2-4 minutes SVE:   Dilation: Lip/rim Effacement (%): 80 Station: 0 Exam by:: Lamar Naef MD  Labs: Lab Results  Component Value Date   WBC 17.5 (H) 08/25/2023   HGB 12.6 08/25/2023   HCT 39.0 08/25/2023   MCV 82.1 08/25/2023   PLT 372 08/25/2023    Assessment / Plan: Spontaneous labor, progressing normally  Labor:Progressing normally, AROM blood tinged. Fetal Wellbeing:  Category 2 for variables, overall reassuring Pain Control:  Epidural Anticipated MOD:  NSVD  Willa Frater, MD 08/25/2023, 11:43 PM

## 2023-08-25 NOTE — Anesthesia Preprocedure Evaluation (Signed)
Anesthesia Evaluation  Patient identified by MRN, date of birth, ID band Patient awake    Reviewed: Allergy & Precautions, H&P , NPO status , Patient's Chart, lab work & pertinent test results  Airway Mallampati: II  TM Distance: >3 FB Neck ROM: Full    Dental no notable dental hx.    Pulmonary neg pulmonary ROS   Pulmonary exam normal breath sounds clear to auscultation       Cardiovascular negative cardio ROS Normal cardiovascular exam Rhythm:Regular Rate:Normal     Neuro/Psych negative neurological ROS  negative psych ROS   GI/Hepatic negative GI ROS, Neg liver ROS,,,  Endo/Other  negative endocrine ROS    Renal/GU negative Renal ROS  negative genitourinary   Musculoskeletal negative musculoskeletal ROS (+)    Abdominal   Peds negative pediatric ROS (+)  Hematology negative hematology ROS (+)   Anesthesia Other Findings   Reproductive/Obstetrics (+) Pregnancy                             Anesthesia Physical Anesthesia Plan  ASA: 2  Anesthesia Plan: Epidural   Post-op Pain Management:    Induction:   PONV Risk Score and Plan:   Airway Management Planned:   Additional Equipment:   Intra-op Plan:   Post-operative Plan:   Informed Consent:   Plan Discussed with:   Anesthesia Plan Comments:        Anesthesia Quick Evaluation  

## 2023-08-26 ENCOUNTER — Encounter (HOSPITAL_COMMUNITY): Payer: Self-pay | Admitting: Obstetrics and Gynecology

## 2023-08-26 LAB — RPR: RPR Ser Ql: NONREACTIVE

## 2023-08-26 MED ORDER — TETANUS-DIPHTH-ACELL PERTUSSIS 5-2.5-18.5 LF-MCG/0.5 IM SUSY: 0.5000 mL | PREFILLED_SYRINGE | Freq: Once | INTRAMUSCULAR | Status: DC

## 2023-08-26 MED ORDER — BENZOCAINE-MENTHOL 20-0.5 % EX AERO: 1.0000 | INHALATION_SPRAY | CUTANEOUS | Status: DC | PRN

## 2023-08-26 MED ORDER — IBUPROFEN 600 MG PO TABS
600.0000 mg | ORAL_TABLET | Freq: Four times a day (QID) | ORAL | Status: DC
Start: 2023-08-26 — End: 2023-08-28
  Administered 2023-08-26 – 2023-08-28 (×10): 600 mg via ORAL
  Filled 2023-08-26 (×10): qty 1

## 2023-08-26 MED ORDER — COCONUT OIL OIL
1.0000 | TOPICAL_OIL | Status: DC | PRN
  Administered 2023-08-27: 1 via TOPICAL

## 2023-08-26 MED ORDER — SIMETHICONE 80 MG PO CHEW
80.0000 mg | CHEWABLE_TABLET | ORAL | Status: DC | PRN
Start: 2023-08-26 — End: 2023-08-28

## 2023-08-26 MED ORDER — ONDANSETRON HCL 4 MG/2ML IJ SOLN: 4.0000 mg | INTRAMUSCULAR | Status: DC | PRN

## 2023-08-26 MED ORDER — ACETAMINOPHEN 325 MG PO TABS: 650.0000 mg | ORAL_TABLET | ORAL | Status: DC | PRN

## 2023-08-26 MED ORDER — WITCH HAZEL-GLYCERIN EX PADS: 1.0000 | MEDICATED_PAD | CUTANEOUS | Status: DC | PRN

## 2023-08-26 MED ORDER — PRENATAL MULTIVITAMIN CH
1.0000 | ORAL_TABLET | Freq: Every day | ORAL | Status: DC
  Administered 2023-08-26 – 2023-08-28 (×3): 1 via ORAL
  Filled 2023-08-26 (×3): qty 1

## 2023-08-26 MED ORDER — DIBUCAINE (PERIANAL) 1 % EX OINT: 1.0000 | TOPICAL_OINTMENT | CUTANEOUS | Status: DC | PRN

## 2023-08-26 MED ORDER — ONDANSETRON HCL 4 MG PO TABS: 4.0000 mg | ORAL_TABLET | ORAL | Status: DC | PRN

## 2023-08-26 MED ORDER — ESCITALOPRAM OXALATE 10 MG PO TABS
10.0000 mg | ORAL_TABLET | Freq: Every day | ORAL | Status: DC
Start: 2023-08-26 — End: 2023-08-28
  Administered 2023-08-26 – 2023-08-28 (×3): 10 mg via ORAL
  Filled 2023-08-26 (×3): qty 1

## 2023-08-26 MED ORDER — SENNOSIDES-DOCUSATE SODIUM 8.6-50 MG PO TABS
2.0000 | ORAL_TABLET | Freq: Every day | ORAL | Status: DC
  Administered 2023-08-27 – 2023-08-28 (×2): 2 via ORAL
  Filled 2023-08-26 (×2): qty 2

## 2023-08-26 MED ORDER — DIPHENHYDRAMINE HCL 25 MG PO CAPS: 25.0000 mg | ORAL_CAPSULE | Freq: Four times a day (QID) | ORAL | Status: DC | PRN

## 2023-08-26 NOTE — Progress Notes (Signed)
Post Partum Day 0 Subjective: no complaints, up ad lib, voiding, tolerating PO, + flatus, and lochia moderate. Some vaginal swelling but improving with ice packs. She denies CP, SOB or HA. Bonding well with baby  Objective: Blood pressure 117/66, pulse 63, temperature 98.6 F (37 C), temperature source Oral, resp. rate 18, height 5\' 8"  (1.727 m), weight 81.6 kg, SpO2 98%, unknown if currently breastfeeding.  Physical Exam:  General: alert, cooperative, and no distress Lochia: appropriate Uterine Fundus: firm Incision: n/a DVT Evaluation: No evidence of DVT seen on physical exam. No significant calf/ankle edema.  Recent Labs    08/25/23 1602  HGB 12.6  HCT 39.0    Assessment/Plan: Plan for discharge tomorrow and Circumcision prior to discharge   LOS: 1 day   Maze Corniel W Maxmilian Trostel, DO 08/26/2023, 12:13 PM

## 2023-08-26 NOTE — Anesthesia Postprocedure Evaluation (Signed)
Anesthesia Post Note  Patient: Michelle Le  Procedure(s) Performed: AN AD HOC LABOR EPIDURAL     Patient location during evaluation: Mother Baby Anesthesia Type: Epidural Level of consciousness: awake, oriented and awake and alert Pain management: pain level controlled Vital Signs Assessment: post-procedure vital signs reviewed and stable Respiratory status: spontaneous breathing, respiratory function stable and nonlabored ventilation Cardiovascular status: stable Postop Assessment: no headache, adequate PO intake, able to ambulate, patient able to bend at knees and no apparent nausea or vomiting Anesthetic complications: no   No notable events documented.  Last Vitals:  Vitals:   08/26/23 0805 08/26/23 1341  BP: 117/66 115/74  Pulse: 63 79  Resp: 18   Temp: 37 C 36.7 C  SpO2: 98% 98%    Last Pain:  Vitals:   08/26/23 1252  TempSrc:   PainSc: 0-No pain   Pain Goal:                   Michelle Le

## 2023-08-26 NOTE — Lactation Note (Signed)
This note was copied from a baby's chart. Lactation Consultation Note  Patient Name: Michelle Le [id number] Date: 08/26/2023 Age:24 hours Reason for consult: Initial assessment;1st time breastfeeding;Early term 37-38.6wks  P1, Family needing assistance with latching. Reviewed hand expression with drops expressed. Assisted with latching in both cross cradle and football holds. Baby was eager and sustained latch for more than 15 min. Feed on demand with cues.  Goal 8-12+ times per day after first 24 hrs.   Suggest calling for help with latching assistance as needed.  Maternal Data Has patient been taught Hand Expression?: Yes Does the patient have breastfeeding experience prior to this delivery?: No  Feeding Mother's Current Feeding Choice: Breast Milk  LATCH Score Latch: Grasps breast easily, tongue down, lips flanged, rhythmical sucking.  Audible Swallowing: A few with stimulation  Type of Nipple: Everted at rest and after stimulation  Comfort (Breast/Nipple): Soft / non-tender  Hold (Positioning): Assistance needed to correctly position infant at breast and maintain latch.  LATCH Score: 8 Interventions Interventions: Breast feeding basics reviewed;Assisted with latch;Hand express;Education;LC Services brochure  Consult Status Consult Status: Follow-up Date: 08/27/23 Follow-up type: In-patient    Dahlia Byes [facility]  RN IBCLC 08/26/2023, 10:22 AM

## 2023-08-27 MED ORDER — IBUPROFEN 600 MG PO TABS: 600.0000 mg | ORAL_TABLET | Freq: Four times a day (QID) | ORAL | 0 refills | Status: AC

## 2023-08-27 NOTE — Discharge Summary (Signed)
Postpartum Discharge Summary      Patient Name: Michelle Le DOB: 2000-10-23 MRN: [id number]  Date of admission: 08/25/2023 Delivery date:08/26/2023 Delivering provider: Ambrose Mantle A Date of discharge: 08/28/2023  Admitting diagnosis: Indication for care in labor or delivery [O75.9] Intrauterine pregnancy: [redacted]w[redacted]d     Secondary diagnosis:  Principal Problem:   Indication for care in labor or delivery    Discharge diagnosis: Term Pregnancy Delivered                                               Hospital course: Onset of Labor With Vaginal Delivery      22 y.o. yo G1P1001 at [redacted]w[redacted]d was admitted in Active Labor on 08/25/2023. Labor course was complicated by nothing  Membrane Rupture Time/Date: 11:29 PM,08/25/2023  Delivery Method:Vaginal, Spontaneous Episiotomy: None Lacerations:  Periurethral;1st degree Patient had a postpartum course complicated by nothing.  She is ambulating, tolerating a regular diet, passing flatus, and urinating well. Patient is discharged home in stable condition on 08/27/23.  Newborn Data: Birth date:08/26/2023 Birth time:1:30 AM Gender:Female Living status:Living Apgars:9 ,9  Weight:3856 g  Immunizations administered: There is no immunization history for the selected administration types on file for this patient.  Physical exam  Vitals:   08/26/23 0805 08/26/23 1341 08/26/23 2022 08/27/23 0518  BP: 117/66 115/74 129/79 128/78  Pulse: 63 79 75 76  Resp: 18 18 18    Temp: 98.6 F (37 C) 98.1 F (36.7 C) 98.1 F (36.7 C) 98.4 F (36.9 C)  TempSrc: Oral Oral Oral Oral  SpO2: 98% 98% 97% 97%  Weight:      Height:       General: alert Lochia: appropriate Uterine Fundus: firm  Labs: Lab Results  Component Value Date   WBC 17.5 (H) 08/25/2023   HGB 12.6 08/25/2023   HCT 39.0 08/25/2023   MCV 82.1 08/25/2023   PLT 372 08/25/2023      Latest Ref Rng & Units 10/06/2019   10:12 PM  CMP  Glucose 70 - 99 mg/dL 94   BUN 6 - 20  mg/dL 11   Creatinine 4.09 - 1.00 mg/dL 8.11   Sodium 914 - 782 mmol/L 139   Potassium 3.5 - 5.1 mmol/L 3.7   Chloride 98 - 111 mmol/L 106   CO2 22 - 32 mmol/L 22   Calcium 8.9 - 10.3 mg/dL 9.3    Edinburgh Score:    08/26/2023    4:54 PM  Edinburgh Postnatal Depression Scale Screening Tool  I have been able to laugh and see the funny side of things. 0  I have looked forward with enjoyment to things. 0  I have blamed myself unnecessarily when things went wrong. 0  I have been anxious or worried for no good reason. 2  I have felt scared or panicky for no good reason. 2  Things have been getting on top of me. 0  I have been so unhappy that I have had difficulty sleeping. 0  I have felt sad or miserable. 0  I have been so unhappy that I have been crying. 0  The thought of harming myself has occurred to me. 0  Edinburgh Postnatal Depression Scale Total 4      After visit meds:  Allergies as of 08/27/2023       Reactions   Amoxicillin Hives  Azithromycin Hives   Bee Venom Swelling   Penicillins Hives        Medication List     TAKE these medications    escitalopram 10 MG tablet Commonly known as: LEXAPRO Take 10 mg by mouth daily.   ibuprofen 600 MG tablet Commonly known as: ADVIL Take 1 tablet (600 mg total) by mouth every 6 (six) hours.   prenatal multivitamin Tabs tablet Take 1 tablet by mouth daily at 12 noon.         Discharge home in stable condition Infant Feeding: Breast Infant Disposition:home with mother Discharge instruction: per After Visit Summary and Postpartum booklet. Activity: Advance as tolerated. Pelvic rest for 6 weeks.  Diet: routine diet Postpartum Appointment:4 weeks Follow up Visit:  Follow-up Information     Ob/Gyn, Nestor Ramp. Schedule an appointment as soon as possible for a visit in 4 week(s).   Contact information: [address] [place] [address] [phone]                      08/27/2023 Zenaida Niece, MD

## 2023-08-27 NOTE — Progress Notes (Signed)
PPD #1 No problems, wants to go home today Afeb, VSS Fundus firm, NT at U-1 Continue routine postpartum care, d/c home 

## 2023-08-27 NOTE — Lactation Note (Signed)
This note was copied from a baby's chart. Lactation Consultation Note  Patient Name: Michelle Le [id number] Date: 08/27/2023 Age:24 hours Reason for consult: Follow-up assessment;Early term 37-38.6wks;1st time breastfeeding;Primapara;Infant weight loss  P1 mom of 36 hour old infant "Michelle Le" consulted for follow up assessment. Michelle Le is at 10% weight loss. Mom initiated supplementation. LC encouraged parents and reassured of breastfeeding support and normalcy of milk production. Reviewed education on breast stimulation, frequency of feedings and suggested setting up a DEBP. Mom agreed and receptive to pump education. LC educated on how to use the DEBP with size 18mm flanges. Educated on sizing up if 18mm becomes uncomfortable (use of 21mm). Reviewed use of initiation mode and how to clean and store breast milk.   LC discussed sizing nipples once home for the MomCozy breast pump for home use. Provided educational handouts on CDC cleaning, milk storage, LC services Outpatient, flange sizing, etc.   Parents understand feeding plan:  1.Offer baby the breast but to not exceed 30 minutes of total feeding time 2. Supplement with formula according to Feeding Amounts guidelines 3. Pump both breasts to protect milk supply and encourage lactogenesis II  Answered mom's questions on how to know when baby is getting enough from the breast: educated on infant's relaxed muscle tension post feed, softened breasts, infant asleep or in relaxed state and more.   Parents appreciative of LC support.  Maternal Data Has patient been taught Hand Expression?: Yes Does the patient have breastfeeding experience prior to this delivery?: No  Feeding Mother's Current Feeding Choice: Breast Milk and Formula Nipple Type: Slow - flow  LATCH Score Infant asleep during LC entry.   Lactation Tools Discussed/Used Tools: Flanges;Pump Flange Size: 18 Breast pump type: Double-Electric Breast Pump Pump Education: Setup,  frequency, and cleaning;Milk Storage Reason for Pumping: infant weight loss 10% Pumping frequency: educated on use of the pump; did not initiate, patient to pump after next feeding  Interventions Interventions: Breast feeding basics reviewed;DEBP;Education;LC Services brochure;CDC Guidelines for Breast Pump Cleaning;Guidelines for Milk Supply and Pumping Schedule Handout;Pace feeding  Discharge Discharge Education: Outpatient recommendation Pump: Personal;Hands Free;DEBP (MomCozy)  Consult Status Consult Status: Follow-up Date: 08/28/23 Follow-up type: In-patient    Su Grand 08/27/2023, 1:44 PM

## 2023-08-27 NOTE — Discharge Instructions (Signed)
As per discharge pamphlet °

## 2023-08-27 NOTE — Progress Notes (Signed)
MOB was referred for history of anxiety.  * Referral screened out by Clinical Social Worker because none of the following criteria appear to apply:  ~ History of anxiety during this pregnancy, or of post-partum depression following prior delivery.  ~ Diagnosis of anxiety within last 3 years  OR  * MOB's symptoms currently being treated with medication and/or therapy.  Per OB notes, MOB is prescribed Lexapro 10 mg for support  Please contact the Clinical Social Worker if needs arise, by [facility] request, or if MOB scores greater than 9/yes to question 10 on Edinburgh Postpartum Depression Screen.  Enos Fling, Theresia Majors Clinical Social Worker [phone]

## 2023-08-28 ENCOUNTER — Ambulatory Visit (HOSPITAL_COMMUNITY): Payer: Self-pay

## 2023-08-28 NOTE — Lactation Note (Signed)
This note was copied from a baby's chart. Lactation Consultation Note  Patient Name: Michelle Le [id number] Date: 08/28/2023 Age:24 hours Reason for consult: Follow-up assessment;1st time breastfeeding;Early term 37-38.6wks  P1, Infant's weight has increased.  Mother is breastfeeding and supplementing with formula.   She feels her breasts are filling Discussed stools transitioning.  Reviewed engorgement care and monitoring voids/stools. Suggest mother call for LC to view next feeding.   Maternal Data Has patient been taught Hand Expression?: Yes Does the patient have breastfeeding experience prior to this delivery?: No  Feeding Mother's Current Feeding Choice: Breast Milk and Formula  Interventions Interventions: Education  Discharge Discharge Education: Engorgement and breast care;Warning signs for feeding baby;Outpatient recommendation Pump: Personal;Hands Free  Consult Status Consult Status: Complete Date: 08/28/23    Dahlia Byes [facility] 08/28/2023, 11:56 AM

## 2023-08-28 NOTE — Lactation Note (Addendum)
This note was copied from a baby's chart. Lactation Consultation Note  Patient Name: Michelle Le [id number] Date: 08/28/2023 Age:24 hours Reason for consult: Follow-up assessment  P1, Weight increased. Baby was latched when [facility] entered room with frequent rhythmical sucks and swallows. Praised mother for her efforts.  Stools transitioning to seedy. Reviewed engorgement care and monitoring voids/stools.  Maternal Data Has patient been taught Hand Expression?: Yes Does the patient have breastfeeding experience prior to this delivery?: No  Feeding Mother's Current Feeding Choice: Breast Milk and Formula  LATCH Score Latch: Grasps breast easily, tongue down, lips flanged, rhythmical sucking.  Audible Swallowing: A few with stimulation  Type of Nipple: Everted at rest and after stimulation  Comfort (Breast/Nipple): Soft / non-tender  Hold (Positioning): No assistance needed to correctly position infant at breast.  LATCH Score: 9   Interventions Interventions: Breast feeding basics reviewed;Education  Discharge Discharge Education: Engorgement and breast care;Warning signs for feeding baby;Outpatient recommendation Pump: Personal;Hands Free  Consult Status Consult Status: Complete Date: 08/28/23    Dahlia Byes [facility] 08/28/2023, 1:57 PM

## 2023-09-03 ENCOUNTER — Inpatient Hospital Stay (HOSPITAL_COMMUNITY)
Admission: RE | Admit: 2023-09-03 | Payer: BC Managed Care – PPO | Source: Home / Self Care | Admitting: Obstetrics and Gynecology

## 2023-09-04 ENCOUNTER — Telehealth (HOSPITAL_COMMUNITY): Payer: Self-pay

## 2023-09-04 NOTE — Telephone Encounter (Signed)
 09/04/2023 1823  Name: Michelle Le MRN: [id number] DOB: 2001-08-18  Reason for Call:  Transition of Care Hospital Discharge Call  Contact Status: Patient Contact Status: Complete  Language assistant needed:          Follow-Up Questions: Do You Have Any Concerns About Your Health As You Heal From Delivery?: No Do You Have Any Concerns About Your Infants Health?: No  Edinburgh Postnatal Depression Scale:  In the Past 7 Days:    PHQ2-9 Depression Scale:     Discharge Follow-up: Edinburgh score requires follow up?:  (RN explained EPDS to patient. Patient declines EPDS. Patient states that she is doing well emotionally.) Patient was advised of the following resources:: Breastfeeding Support Group, Support Group  Post-discharge interventions: Reviewed Newborn Safe Sleep Practices  Signature   Rosaline Deretha PEAK

## 2023-09-09 ENCOUNTER — Encounter (HOSPITAL_COMMUNITY): Payer: Managed Care, Other (non HMO)

## 2023-09-11 ENCOUNTER — Inpatient Hospital Stay (HOSPITAL_COMMUNITY)
Admission: RE | Admit: 2023-09-11 | Payer: Managed Care, Other (non HMO) | Source: Home / Self Care | Admitting: Obstetrics

## 2023-09-11 ENCOUNTER — Inpatient Hospital Stay (HOSPITAL_COMMUNITY): Payer: Managed Care, Other (non HMO)
